# Patient Record
Sex: Female | Born: 1975 | Race: White | Hispanic: No | Smoking: Current every day smoker
Health system: Southern US, Community
[De-identification: ages and names within clinical notes are randomized; demographics above are authoritative.]

## PROBLEM LIST (undated history)

## (undated) DIAGNOSIS — F419 Anxiety disorder, unspecified: Secondary | ICD-10-CM

## (undated) DIAGNOSIS — I10 Essential (primary) hypertension: Secondary | ICD-10-CM

---

## 1998-08-24 ENCOUNTER — Emergency Department (HOSPITAL_COMMUNITY): Admission: EM | Admit: 1998-08-24 | Discharge: 1998-08-24 | Payer: Self-pay | Admitting: Emergency Medicine

## 1998-11-15 ENCOUNTER — Emergency Department (HOSPITAL_COMMUNITY): Admission: EM | Admit: 1998-11-15 | Discharge: 1998-11-15 | Payer: Self-pay

## 1999-01-31 ENCOUNTER — Encounter: Payer: Self-pay | Admitting: Emergency Medicine

## 1999-01-31 ENCOUNTER — Emergency Department (HOSPITAL_COMMUNITY): Admission: EM | Admit: 1999-01-31 | Discharge: 1999-01-31 | Payer: Self-pay | Admitting: Emergency Medicine

## 1999-07-22 ENCOUNTER — Ambulatory Visit (HOSPITAL_COMMUNITY): Admission: RE | Admit: 1999-07-22 | Discharge: 1999-07-22 | Payer: Self-pay | Admitting: Cardiovascular Disease

## 1999-10-23 ENCOUNTER — Emergency Department (HOSPITAL_COMMUNITY): Admission: EM | Admit: 1999-10-23 | Discharge: 1999-10-23 | Payer: Self-pay | Admitting: Emergency Medicine

## 1999-10-27 ENCOUNTER — Emergency Department (HOSPITAL_COMMUNITY): Admission: EM | Admit: 1999-10-27 | Discharge: 1999-10-27 | Payer: Self-pay | Admitting: Emergency Medicine

## 2000-07-01 ENCOUNTER — Emergency Department (HOSPITAL_COMMUNITY): Admission: EM | Admit: 2000-07-01 | Discharge: 2000-07-01 | Payer: Self-pay | Admitting: Internal Medicine

## 2000-07-18 ENCOUNTER — Emergency Department (HOSPITAL_COMMUNITY): Admission: EM | Admit: 2000-07-18 | Discharge: 2000-07-18 | Payer: Self-pay | Admitting: Emergency Medicine

## 2000-07-26 ENCOUNTER — Inpatient Hospital Stay (HOSPITAL_COMMUNITY): Admission: AD | Admit: 2000-07-26 | Discharge: 2000-07-26 | Payer: Self-pay | Admitting: *Deleted

## 2000-10-24 ENCOUNTER — Emergency Department (HOSPITAL_COMMUNITY): Admission: EM | Admit: 2000-10-24 | Discharge: 2000-10-24 | Payer: Self-pay | Admitting: Emergency Medicine

## 2000-12-02 ENCOUNTER — Emergency Department (HOSPITAL_COMMUNITY): Admission: EM | Admit: 2000-12-02 | Discharge: 2000-12-02 | Payer: Self-pay | Admitting: *Deleted

## 2001-06-20 ENCOUNTER — Other Ambulatory Visit: Admission: RE | Admit: 2001-06-20 | Discharge: 2001-06-20 | Payer: Self-pay | Admitting: Family Medicine

## 2003-03-10 ENCOUNTER — Encounter: Admission: RE | Admit: 2003-03-10 | Discharge: 2003-03-10 | Payer: Self-pay | Admitting: Cardiovascular Disease

## 2003-03-10 ENCOUNTER — Encounter: Payer: Self-pay | Admitting: Cardiovascular Disease

## 2003-12-10 ENCOUNTER — Emergency Department (HOSPITAL_COMMUNITY): Admission: EM | Admit: 2003-12-10 | Discharge: 2003-12-10 | Payer: Self-pay | Admitting: Emergency Medicine

## 2004-11-11 ENCOUNTER — Ambulatory Visit: Payer: Self-pay | Admitting: Internal Medicine

## 2004-11-11 ENCOUNTER — Ambulatory Visit (HOSPITAL_BASED_OUTPATIENT_CLINIC_OR_DEPARTMENT_OTHER): Admission: RE | Admit: 2004-11-11 | Discharge: 2004-11-11 | Payer: Self-pay | Admitting: Family Medicine

## 2005-05-18 ENCOUNTER — Ambulatory Visit (HOSPITAL_COMMUNITY): Admission: RE | Admit: 2005-05-18 | Discharge: 2005-05-18 | Payer: Self-pay | Admitting: Family Medicine

## 2006-06-02 ENCOUNTER — Inpatient Hospital Stay (HOSPITAL_COMMUNITY): Admission: AD | Admit: 2006-06-02 | Discharge: 2006-06-03 | Payer: Self-pay | Admitting: Family Medicine

## 2006-06-16 ENCOUNTER — Inpatient Hospital Stay (HOSPITAL_COMMUNITY): Admission: AD | Admit: 2006-06-16 | Discharge: 2006-06-16 | Payer: Self-pay | Admitting: Obstetrics and Gynecology

## 2007-04-11 ENCOUNTER — Emergency Department (HOSPITAL_COMMUNITY): Admission: EM | Admit: 2007-04-11 | Discharge: 2007-04-11 | Payer: Self-pay | Admitting: Emergency Medicine

## 2007-04-13 ENCOUNTER — Emergency Department (HOSPITAL_COMMUNITY): Admission: EM | Admit: 2007-04-13 | Discharge: 2007-04-13 | Payer: Self-pay | Admitting: Family Medicine

## 2007-10-12 ENCOUNTER — Emergency Department: Payer: Self-pay | Admitting: Emergency Medicine

## 2008-04-28 ENCOUNTER — Emergency Department (HOSPITAL_COMMUNITY): Admission: EM | Admit: 2008-04-28 | Discharge: 2008-04-28 | Payer: Self-pay | Admitting: Emergency Medicine

## 2008-04-28 ENCOUNTER — Emergency Department (HOSPITAL_COMMUNITY): Admission: EM | Admit: 2008-04-28 | Discharge: 2008-04-29 | Payer: Self-pay | Admitting: Emergency Medicine

## 2008-05-09 ENCOUNTER — Emergency Department (HOSPITAL_COMMUNITY): Admission: EM | Admit: 2008-05-09 | Discharge: 2008-05-10 | Payer: Self-pay | Admitting: Emergency Medicine

## 2009-07-21 ENCOUNTER — Emergency Department: Payer: Self-pay | Admitting: Emergency Medicine

## 2010-07-25 ENCOUNTER — Emergency Department (HOSPITAL_COMMUNITY): Admission: EM | Admit: 2010-07-25 | Discharge: 2010-07-26 | Payer: Self-pay | Admitting: Emergency Medicine

## 2010-10-22 ENCOUNTER — Emergency Department: Payer: Self-pay | Admitting: Emergency Medicine

## 2010-11-13 ENCOUNTER — Encounter: Payer: Self-pay | Admitting: Family Medicine

## 2011-01-05 LAB — DIFFERENTIAL
Basophils Absolute: 0 10*3/uL (ref 0.0–0.1)
Basophils Relative: 0 % (ref 0–1)
Eosinophils Absolute: 0.1 10*3/uL (ref 0.0–0.7)
Eosinophils Relative: 1 % (ref 0–5)
Monocytes Relative: 4 % (ref 3–12)

## 2011-01-05 LAB — CBC
HCT: 37 % (ref 36.0–46.0)
MCHC: 33.2 g/dL (ref 30.0–36.0)
MCV: 91.4 fL (ref 78.0–100.0)
Platelets: 227 10*3/uL (ref 150–400)
RBC: 4.05 MIL/uL (ref 3.87–5.11)
WBC: 10.6 10*3/uL — ABNORMAL HIGH (ref 4.0–10.5)

## 2011-01-05 LAB — POCT I-STAT, CHEM 8
BUN: <3 mg/dL — ABNORMAL LOW (ref 6–23)
Chloride: 106 mEq/L (ref 96–112)
Glucose, Bld: 83 mg/dL (ref 70–99)
HCT: 38 % (ref 36.0–46.0)
Hemoglobin: 12.9 g/dL (ref 12.0–15.0)
Potassium: 3.2 mEq/L — ABNORMAL LOW (ref 3.5–5.1)
Sodium: 143 mEq/L (ref 135–145)

## 2011-01-05 LAB — URINALYSIS, ROUTINE W REFLEX MICROSCOPIC
Glucose, UA: NEGATIVE mg/dL
Hgb urine dipstick: NEGATIVE
Protein, ur: NEGATIVE mg/dL
Urobilinogen, UA: 0.2 mg/dL (ref 0.0–1.0)

## 2011-01-05 LAB — D-DIMER, QUANTITATIVE: D-Dimer, Quant: 0.72 ug/mL-FEU — ABNORMAL HIGH (ref 0.00–0.48)

## 2011-01-05 LAB — POCT CARDIAC MARKERS: Troponin i, poc: <0.05 ng/mL (ref 0.00–0.09)

## 2011-03-10 NOTE — Procedures (Signed)
Diana Lawson, Diana Lawson               ACCOUNT NO.:  [id number]   MEDICAL RECORD NO.:  [id number]          PATIENT TYPE:  OUT   LOCATION:  SLEEP CENTER                 FACILITY:  [facility]   PHYSICIAN:  Clinton D. Maple Hudson, M.D. DATE OF BIRTH:  Aug 04, 1976   DATE OF STUDY:  11/11/2004                              NOCTURNAL POLYSOMNOGRAM   STUDY DATE:  November 11, 2004   REFERRING PHYSICIAN:  Dr. Windle Guard   INDICATION FOR STUDY:  Hypersomnia with sleep apnea.  Epworth Sleepiness  Score 4/24.  BMI 36.9.  Weight 230 pounds.  Listed medications include  Klonopin which may cause daytime somnolence.  No sleep medications were  taken for this study.   SLEEP ARCHITECTURE:  Short total sleep time 235 minutes with sleep  efficiency 61%.  Stage I was 3%, stage II 34%, stages III and IV 15%, REM  was 8% of total sleep time.  Sleep latency 127 minutes, REM latency 134  minutes, awake after sleep onset 14 minutes, arousal index 13.  Sleep onset  was delayed, 12:47 a.m.  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   RESPIRATORY DATA:  Respiratory disturbance index 3.3 obstructive events per  hour.  This is within normal limits, reflecting a total of 13 hypopneas.  It  does not meet diagnostic criteria for obstructive sleep apnea/hypopnea  syndrome.  Events were only recorded when she was sleeping on her back  suggesting that encouraging a sleep position on the sides may be  therapeutic.  REM RDI was 20.   OXYGEN DATA:  Mild to moderate snoring with oxygen desaturation to a nadir  of 83% briefly.  Mean oxygen saturation through the study was 94% on room  air.   CARDIAC DATA:  Mild sinus tachycardia 105-107 per minute.   MOVEMENT/PARASOMNIA:  Occasional leg jerk, insignificant.   IMPRESSION/RECOMMENDATION:  Occasional sleep-disordered breathing events,  within normal limits, not  meeting diagnostic criteria for obstructive sleep  apnea syndrome and not qualifying by usual criteria for  administration of continuous positive airway pressure.  She may benefit from  instruction to sleep off the flat of her back, weight loss, and treatment  for nasal congestion if appropriate.      CDY/MEDQ  D:  11/13/2004 16:02:30  T:  11/13/2004 19:38:42  Job:  [id number]

## 2011-07-20 LAB — POCT CARDIAC MARKERS
CKMB, poc: <1 — ABNORMAL LOW
Myoglobin, poc: 65.9
Operator id: 277751
Troponin i, poc: <0.05

## 2011-07-20 LAB — POCT I-STAT, CHEM 8
Chloride: 107
Glucose, Bld: 78
HCT: 39
Sodium: 140
TCO2: 25

## 2011-09-20 IMAGING — CT CT ANGIO CHEST
2 of 6 series · 19 of 36 positions shown · IV contrast (APPLIED)
Comparison: Chest radiographs 04/28/2008.

CLINICAL DATA: 34-year-old female with shortness of breath, chest
pain, elevated D-dimer

CT ANGIOGRAPHY CHEST WITH CONTRAST
TECHNIQUE: Multidetector CT imaging of the chest was performed
using the standard protocol during bolus administration of
intravenous contrast.  Multiplanar CT image reconstructions
including MIPs were obtained to evaluate the vascular anatomy.
Contrast:  100 ml [id number].

[Series 8: pulm embolism 1.0 b25f thins · axial · 0.57mm/px · z∈[-254,-58]mm · 18 of 218 slices shown]
[im 11/218  lung]
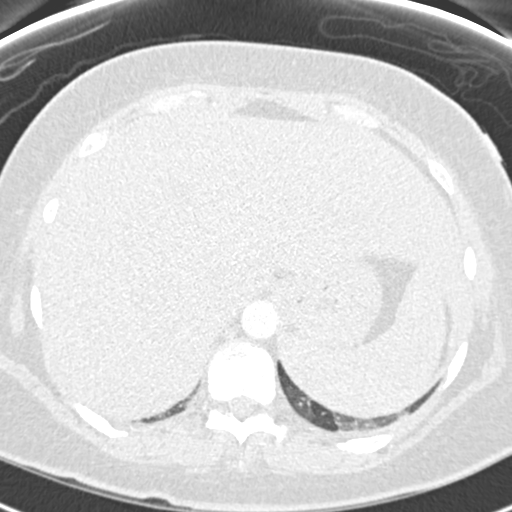
[im 22/218  mediastinal]
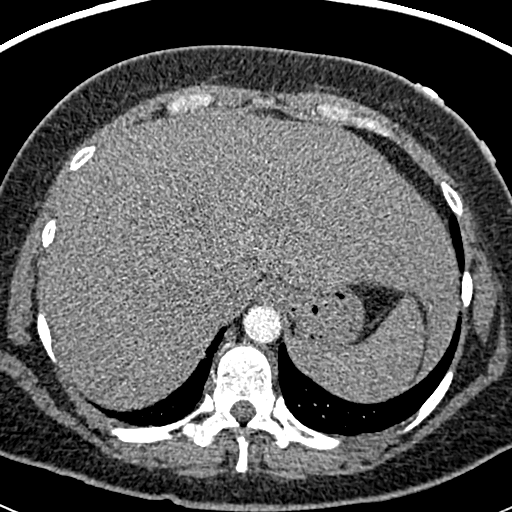
[im 33/218  lung]
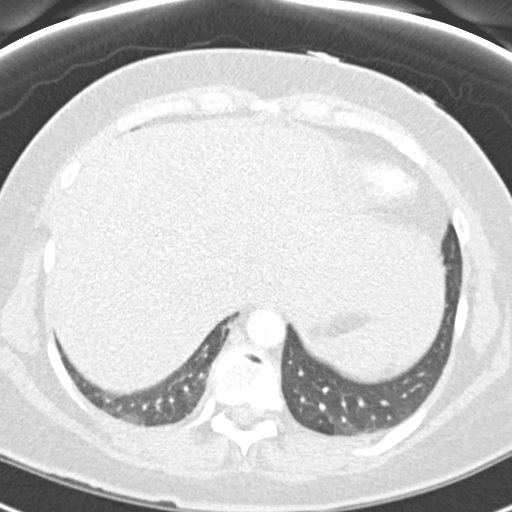
[im 44/218  mediastinal]
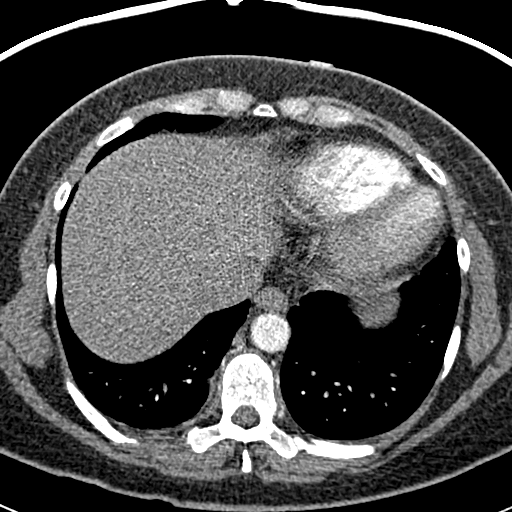
[im 55/218  lung]
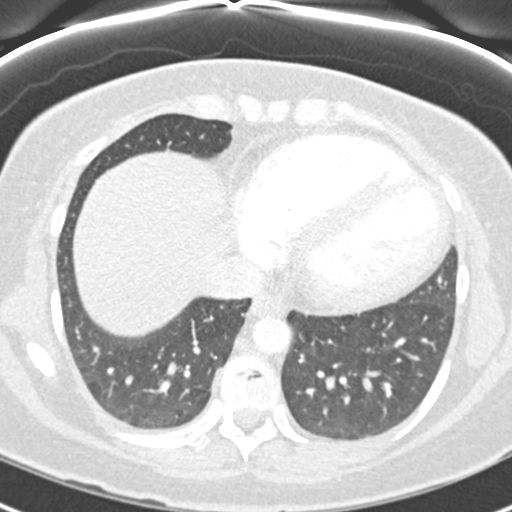
[im 66/218  mediastinal]
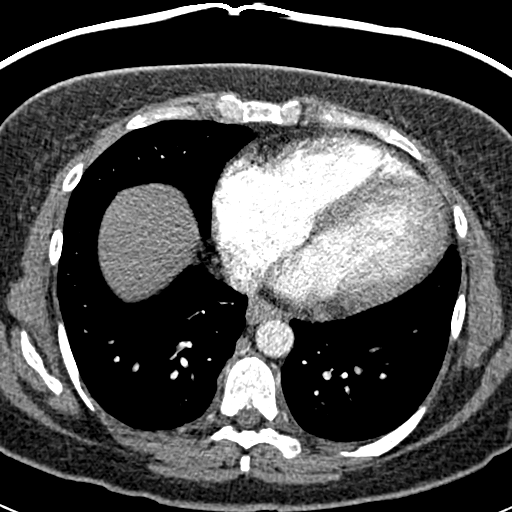
[im 76/218  lung]
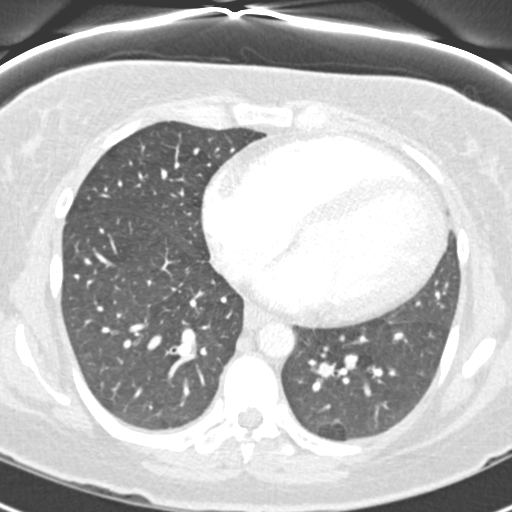
[im 87/218  mediastinal]
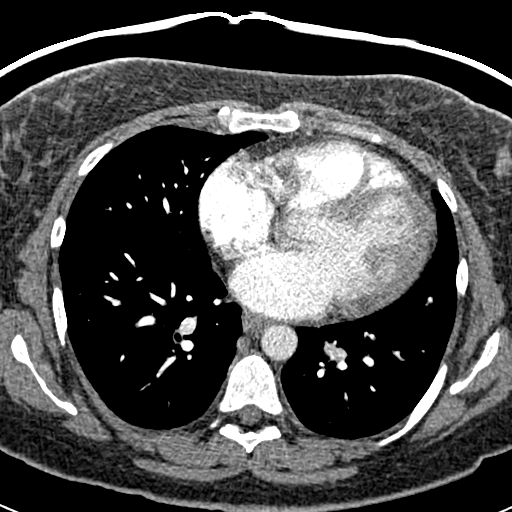
[im 98/218  lung]
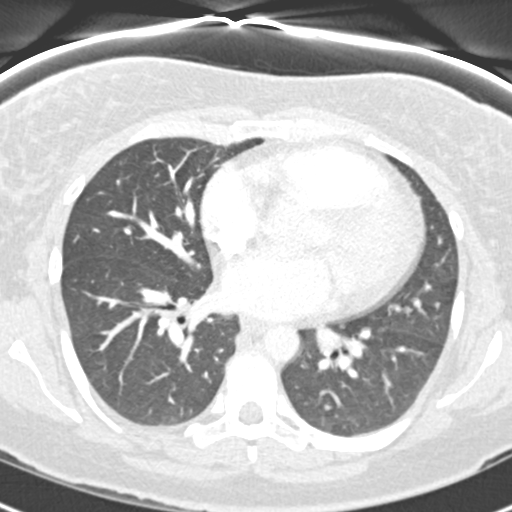
[im 120/218  mediastinal]
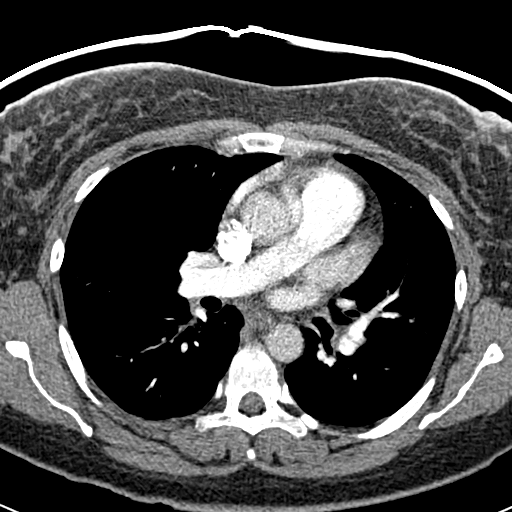
[im 131/218  lung]
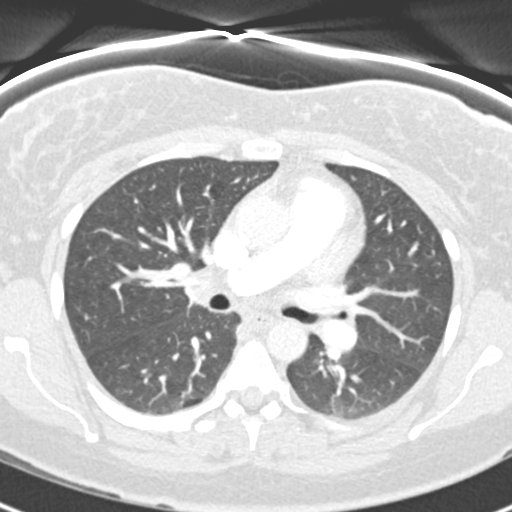
[im 142/218  mediastinal]
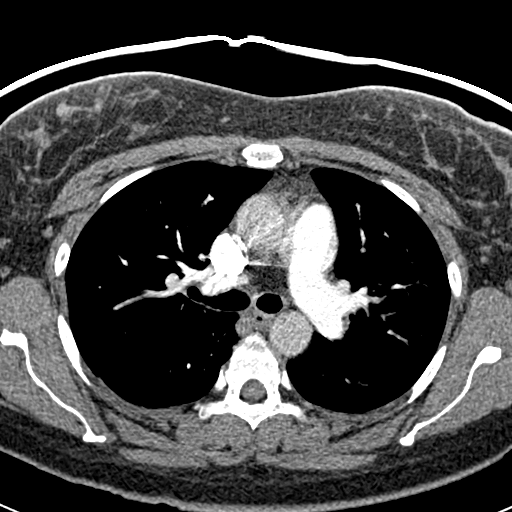
[im 152/218  lung]
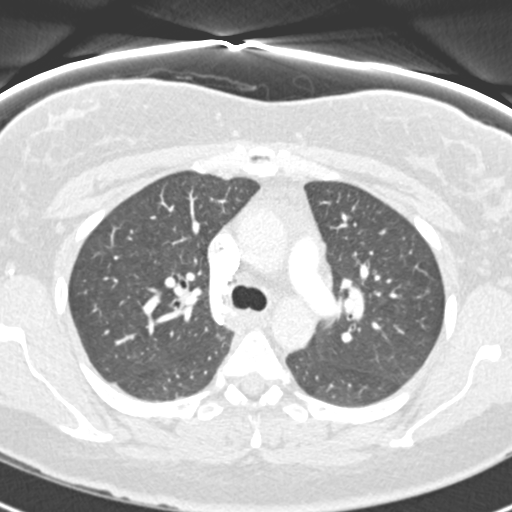
[im 163/218  mediastinal]
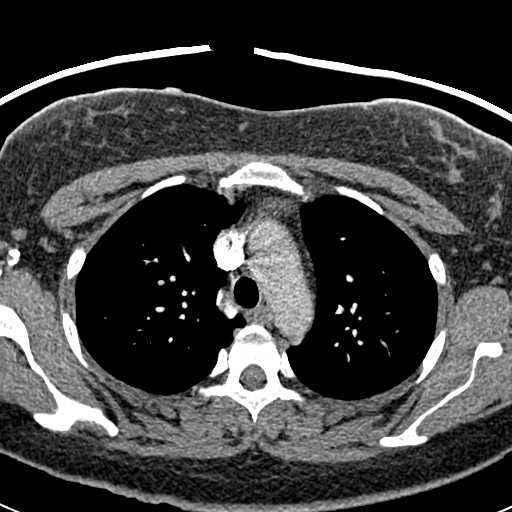
[im 174/218  lung]
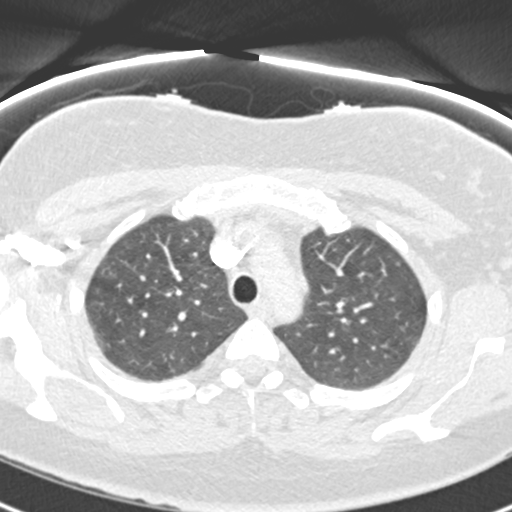
[im 185/218  mediastinal]
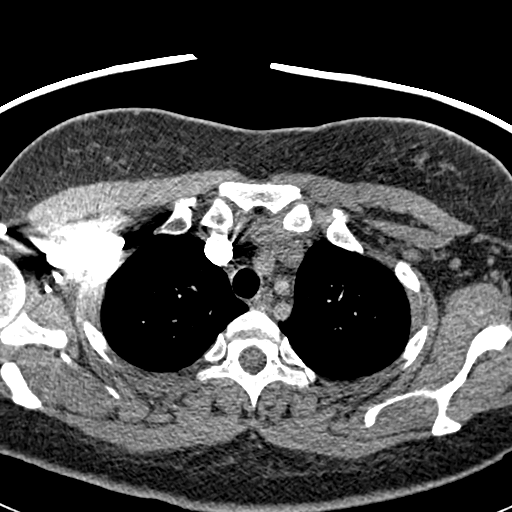
[im 196/218  lung]
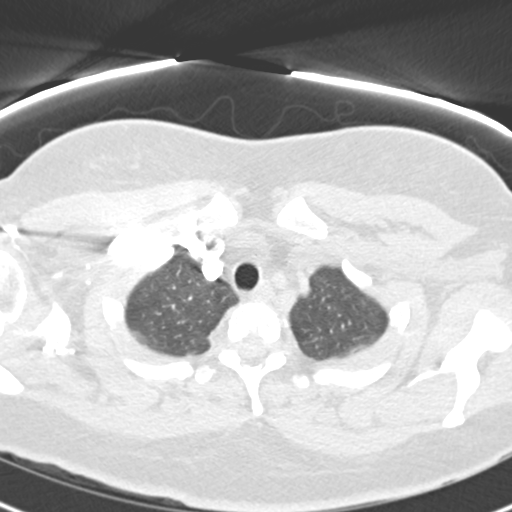
[im 207/218  mediastinal]
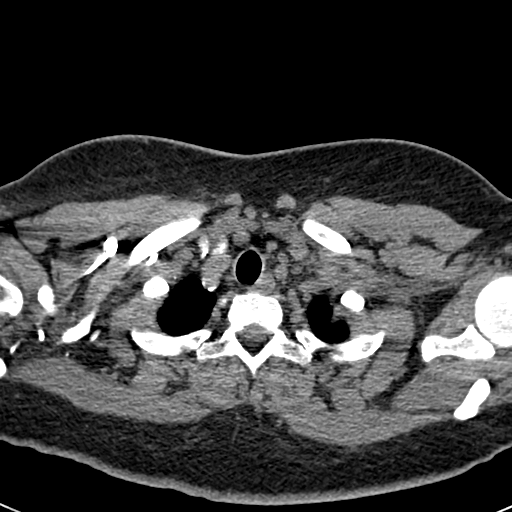

[Series 605: <mpr thick cor · coronal · 0.57mm/px · 1 of 98 slices shown]
[im 49/98  mediastinal]
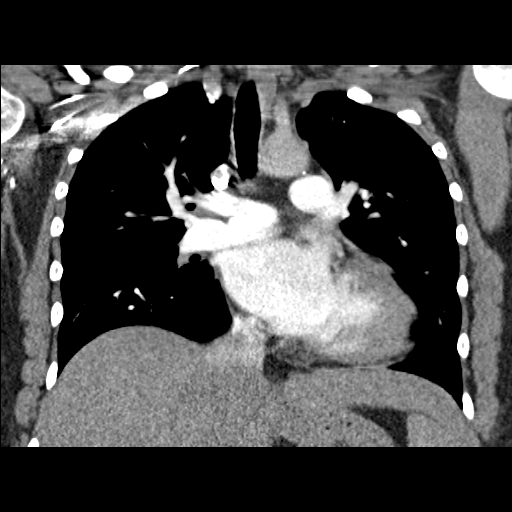

[19 of 36 positions shown; findings below may reference images not displayed]

FINDINGS: Adequate contrast bolus timing in the pulmonary arterial
tree.  Intermittent mild respiratory motion. No focal filling
defect identified in the pulmonary arterial tree to suggest the
presence of acute pulmonary embolism.

Major airways are patent.  Suggestion of mild ground-glass opacity
diffusely, more pronounced at the lung bases.  There are some small
areas of mosaic attenuation in the lower lobes.  Typical
atelectasis in the costophrenic sulci.  Otherwise, the lungs are
clear.

Cardiomegaly.  No pericardial or pleural effusion.  The distal
thoracic aorta and visualized abdominal aorta are unremarkable.
Visualized upper abdominal viscera are within normal limits. No
lymphadenopathy.  Visualized thoracic inlet within normal limits.

Degenerative changes in the lower thoracic spine. No acute osseous
abnormality identified.

Review of the MIP images confirms the above findings.
IMPRESSION: 1. No evidence of acute pulmonary embolus.
2.  Mild diffuse ground-glass opacity in the lungs, nonspecific but
favor related to suboptimal inspiration.
3.  Cardiomegaly.

## 2015-01-19 ENCOUNTER — Emergency Department (HOSPITAL_BASED_OUTPATIENT_CLINIC_OR_DEPARTMENT_OTHER): Payer: Medicaid Other

## 2015-01-19 ENCOUNTER — Emergency Department (HOSPITAL_BASED_OUTPATIENT_CLINIC_OR_DEPARTMENT_OTHER)
Admission: EM | Admit: 2015-01-19 | Discharge: 2015-01-19 | Disposition: A | Discharge status: Home / Self Care | Payer: Medicaid Other | Attending: Emergency Medicine | Admitting: Emergency Medicine

## 2015-01-19 ENCOUNTER — Encounter (HOSPITAL_BASED_OUTPATIENT_CLINIC_OR_DEPARTMENT_OTHER): Payer: Self-pay | Admitting: *Deleted

## 2015-01-19 DIAGNOSIS — Z880 Allergy status to penicillin: Secondary | ICD-10-CM | POA: Insufficient documentation

## 2015-01-19 DIAGNOSIS — Z720 Tobacco use: Secondary | ICD-10-CM | POA: Diagnosis not present

## 2015-01-19 DIAGNOSIS — T1490XA Injury, unspecified, initial encounter: Secondary | ICD-10-CM

## 2015-01-19 DIAGNOSIS — Y9289 Other specified places as the place of occurrence of the external cause: Secondary | ICD-10-CM | POA: Insufficient documentation

## 2015-01-19 DIAGNOSIS — Y998 Other external cause status: Secondary | ICD-10-CM | POA: Diagnosis not present

## 2015-01-19 DIAGNOSIS — M79671 Pain in right foot: Secondary | ICD-10-CM

## 2015-01-19 DIAGNOSIS — I10 Essential (primary) hypertension: Secondary | ICD-10-CM | POA: Insufficient documentation

## 2015-01-19 DIAGNOSIS — Z79899 Other long term (current) drug therapy: Secondary | ICD-10-CM | POA: Insufficient documentation

## 2015-01-19 DIAGNOSIS — X58XXXA Exposure to other specified factors, initial encounter: Secondary | ICD-10-CM | POA: Insufficient documentation

## 2015-01-19 DIAGNOSIS — F419 Anxiety disorder, unspecified: Secondary | ICD-10-CM | POA: Insufficient documentation

## 2015-01-19 DIAGNOSIS — S99921A Unspecified injury of right foot, initial encounter: Secondary | ICD-10-CM | POA: Diagnosis present

## 2015-01-19 DIAGNOSIS — S93491A Sprain of other ligament of right ankle, initial encounter: Secondary | ICD-10-CM | POA: Insufficient documentation

## 2015-01-19 DIAGNOSIS — Y9389 Activity, other specified: Secondary | ICD-10-CM | POA: Diagnosis not present

## 2015-01-19 DIAGNOSIS — S93401A Sprain of unspecified ligament of right ankle, initial encounter: Secondary | ICD-10-CM

## 2015-01-19 HISTORY — DX: Essential (primary) hypertension: I10

## 2015-01-19 HISTORY — DX: Anxiety disorder, unspecified: F41.9

## 2015-01-19 MED ORDER — HYDROCODONE-ACETAMINOPHEN 5-325 MG PO TABS
1.0000 | ORAL_TABLET | Freq: Once | ORAL | Status: AC
  Administered 2015-01-19: 1 via ORAL
  Filled 2015-01-19: qty 1

## 2015-01-19 MED ORDER — IBUPROFEN 600 MG PO TABS: 600.0000 mg | ORAL_TABLET | Freq: Four times a day (QID) | ORAL | Status: AC | PRN

## 2015-01-19 MED ORDER — IBUPROFEN 200 MG PO TABS
600.0000 mg | ORAL_TABLET | Freq: Once | ORAL | Status: AC
  Administered 2015-01-19: 600 mg via ORAL
  Filled 2015-01-19 (×2): qty 1

## 2015-01-19 MED ORDER — HYDROCODONE-ACETAMINOPHEN 5-325 MG PO TABS: 1.0000 | ORAL_TABLET | Freq: Four times a day (QID) | ORAL | Status: DC | PRN

## 2015-01-19 NOTE — ED Provider Notes (Signed)
CSN: [id number]     Arrival date & time 01/19/15  1355 History   First MD Initiated Contact with Patient 01/19/15 1422     Chief Complaint  Patient presents with  . Foot Injury     (Consider location/radiation/quality/duration/timing/severity/associated sxs/prior Treatment) HPI Pt is a 39yo female with hx of anxiety and HTN, presenting to ED with c/o gradually worsening Right ankle pain that started around 10AM after she dropped part of dresser on the top of her right foot. Pt states during the incident she stumbled and believes she also twisted her ankle slightly. Pt did not fall or sustain any other injuries. Reports taking tylenol PTA w/o relief. No previous injury to same ankle. Denies numbness or change in skin color.   Past Medical History  Diagnosis Date  . Anxiety   . Hypertension    History reviewed. No pertinent past surgical history. No family history on file. History  Substance Use Topics  . Smoking status: Current Every Day Smoker -- 0.50 packs/day    Types: Cigarettes  . Smokeless tobacco: Not on file  . Alcohol Use: Yes     Comment: occasionally   OB History    No data available     Review of Systems  Constitutional: Negative for fever and chills.  Respiratory: Negative for cough and shortness of breath.   Cardiovascular: Negative for chest pain.  Musculoskeletal: Positive for myalgias, joint swelling and arthralgias.       Right ankle  Skin: Negative for color change and wound.  Neurological: Negative for numbness.  All other systems reviewed and are negative.     Allergies  Penicillins  Home Medications   Prior to Admission medications   Medication Sig Start Date End Date Taking? Authorizing Provider  Diazepam (VALIUM PO) Take by mouth.   Yes Historical Provider, MD  HYDROcodone-acetaminophen (NORCO/VICODIN) 5-325 MG per tablet Take 1-2 tablets by mouth every 6 (six) hours as needed. 01/19/15   Junius Finner, PA-C  ibuprofen (ADVIL,MOTRIN) 600 MG  tablet Take 1 tablet (600 mg total) by mouth every 6 (six) hours as needed. 01/19/15   Junius Finner, PA-C   BP 158/94 mmHg  Pulse 109  Temp(Src) 98.7 F (37.1 C) (Oral)  Resp 20  Ht  (1.651 m)  Wt 185 lb (83.915 kg)  BMI 30.79 kg/m2  SpO2 96%  LMP 01/05/2015 Physical Exam  Constitutional: She is oriented to person, place, and time. She appears well-developed and well-nourished.  HENT:  Head: Normocephalic and atraumatic.  Eyes: EOM are normal.  Neck: Normal range of motion.  Cardiovascular: Normal rate.   Pulses:      Dorsalis pedis pulses are 2+ on the right side.  Right foot: cap refill <3 sec Tachycardic in triage, regular rate on exam  Pulmonary/Chest: Effort normal.  Musculoskeletal: Normal range of motion. She exhibits edema and tenderness.  Right ankle: no obvious deformity. Mild edema to lateral aspect with tenderness to lateral malleolus and dorsal aspect Right foot. FROM. 4/5 strength compared to left.  FROM Right knee, no calf tenderness.   Neurological: She is alert and oriented to person, place, and time.  Right foot: sensation in tact  Skin: Skin is warm and dry. No erythema.  Skin in tact. No ecchymosis, erythema, or warmth. No red streaking, induration, or evidence of underlying infection.  Psychiatric: She has a normal mood and affect. Her behavior is normal.  Nursing note and vitals reviewed.   ED Course  Procedures (including critical care time)  Labs Review Labs Reviewed - No data to display  Imaging Review Dg Foot Complete Right  01/19/2015   CLINICAL DATA:  Dropped dresser onto foot with pain, initial encounter  EXAM: RIGHT FOOT COMPLETE - 3+ VIEW  COMPARISON:  None.  FINDINGS: There is no evidence of fracture or dislocation. There is no evidence of arthropathy or other focal bone abnormality. Soft tissues are unremarkable.  IMPRESSION: No acute abnormality noted.   Electronically Signed   By: Alcide CleverMark  Lukens M.D.   On: 01/19/2015 14:29     EKG  Interpretation None      MDM   Final diagnoses:  Right ankle sprain, initial encounter  Right foot pain    Pt c/o Right foot and ankle pain after dropping a dresser on it and partially twisting ankle earlier this morning. Foot is neurovascularly in tact. Mild tachycardic in triage, likely due to pain. Pt denies CP or SOB.  Plain films: no acute abnormality. Will tx as sprain. ASO applied, crutches provided. Rx: norco and ibuprofen. Home care instructions provided. Advised to f/u with PCP in 1-2 weeks as pt may need PT or ortho f/u if not improving. Pt verbalized understanding and agreement with tx plan.   Junius Finnerrin O'Malley, PA-C 01/19/15 1515  Vanetta MuldersScott Zackowski, MD 01/25/15 [phone]

## 2015-01-19 NOTE — Discharge Instructions (Signed)
Acute Ankle Sprain °with Phase I Rehab °An acute ankle sprain is a partial or complete tear in one or more of the ligaments of the ankle due to traumatic injury. The severity of the injury depends on both the number of ligaments sprained and the grade of sprain. There are 3 grades of sprains.  °· A grade 1 sprain is a mild sprain. There is a slight pull without obvious tearing. There is no loss of strength, and the muscle and ligament are the correct length. °· A grade 2 sprain is a moderate sprain. There is tearing of fibers within the substance of the ligament where it connects two bones or two cartilages. The length of the ligament is increased, and there is usually decreased strength. °· A grade 3 sprain is a complete rupture of the ligament and is uncommon. °In addition to the grade of sprain, there are three types of ankle sprains.  °Lateral ankle sprains: This is a sprain of one or more of the three ligaments on the outer side (lateral) of the ankle. These are the most common sprains. °Medial ankle sprains: There is one large triangular ligament of the inner side (medial) of the ankle that is susceptible to injury. Medial ankle sprains are less common. °Syndesmosis, "high ankle," sprains: The syndesmosis is the ligament that connects the two bones of the lower leg. Syndesmosis sprains usually only occur with very severe ankle sprains. °SYMPTOMS °· Pain, tenderness, and swelling in the ankle, starting at the side of injury that may progress to the whole ankle and foot with time. °· "Pop" or tearing sensation at the time of injury. °· Bruising that may spread to the heel. °· Impaired ability to walk soon after injury. °CAUSES  °· Acute ankle sprains are caused by trauma placed on the ankle that temporarily forces or pries the anklebone (talus) out of its normal socket. °· Stretching or tearing of the ligaments that normally hold the joint in place (usually due to a twisting injury). °RISK INCREASES  WITH: °· Previous ankle sprain. °· Sports in which the foot may land awkwardly (i.e., basketball, volleyball, or soccer) or walking or running on uneven or rough surfaces. °· Shoes with inadequate support to prevent sideways motion when stress occurs. °· Poor strength and flexibility. °· Poor balance skills. °· Contact sports. °PREVENTION  °· Warm up and stretch properly before activity. °· Maintain physical fitness: °¨ Ankle and leg flexibility, muscle strength, and endurance. °¨ Cardiovascular fitness. °· Balance training activities. °· Use proper technique and have a coach correct improper technique. °· Taping, protective strapping, bracing, or high-top tennis shoes may help prevent injury. Initially, tape is best; however, it loses most of its support function within 10 to 15 minutes. °· Wear proper-fitted protective shoes (High-top shoes with taping or bracing is more effective than either alone). °· Provide the ankle with support during sports and practice activities for 12 months following injury. °PROGNOSIS  °· If treated properly, ankle sprains can be expected to recover completely; however, the length of recovery depends on the degree of injury. °· A grade 1 sprain usually heals enough in 5 to 7 days to allow modified activity and requires an average of 6 weeks to heal completely. °· A grade 2 sprain requires 6 to 10 weeks to heal completely. °· A grade 3 sprain requires 12 to 16 weeks to heal. °· A syndesmosis sprain often takes more than 3 months to heal. °RELATED COMPLICATIONS  °· Frequent recurrence of symptoms may   result in a chronic problem. Appropriately addressing the problem the first time decreases the frequency of recurrence and optimizes healing time. Severity of the initial sprain does not predict the likelihood of later instability. °· Injury to other structures (bone, cartilage, or tendon). °· A chronically unstable or arthritic ankle joint is a possibility with repeated  sprains. °TREATMENT °Treatment initially involves the use of ice, medication, and compression bandages to help reduce pain and inflammation. Ankle sprains are usually immobilized in a walking cast or boot to allow for healing. Crutches may be recommended to reduce pressure on the injury. After immobilization, strengthening and stretching exercises may be necessary to regain strength and a full range of motion. Surgery is rarely needed to treat ankle sprains. °MEDICATION  °· Nonsteroidal anti-inflammatory medications, such as aspirin and ibuprofen (do not take for the first 3 days after injury or within 7 days before surgery), or other minor pain relievers, such as acetaminophen, are often recommended. Take these as directed by your caregiver. Contact your caregiver immediately if any bleeding, stomach upset, or signs of an allergic reaction occur from these medications. °· Ointments applied to the skin may be helpful. °· Pain relievers may be prescribed as necessary by your caregiver. Do not take prescription pain medication for longer than 4 to 7 days. Use only as directed and only as much as you need. °HEAT AND COLD °· Cold treatment (icing) is used to relieve pain and reduce inflammation for acute and chronic cases. Cold should be applied for 10 to 15 minutes every 2 to 3 hours for inflammation and pain and immediately after any activity that aggravates your symptoms. Use ice packs or an ice massage. °· Heat treatment may be used before performing stretching and strengthening activities prescribed by your caregiver. Use a heat pack or a warm soak. °SEEK IMMEDIATE MEDICAL CARE IF:  °· Pain, swelling, or bruising worsens despite treatment. °· You experience pain, numbness, discoloration, or coldness in the foot or toes. °· New, unexplained symptoms develop (drugs used in treatment may produce side effects.) °EXERCISES  °PHASE I EXERCISES °RANGE OF MOTION (ROM) AND STRETCHING EXERCISES - Ankle Sprain, Acute Phase I,  Weeks 1 to 2 °These exercises may help you when beginning to restore flexibility in your ankle. You will likely work on these exercises for the 1 to 2 weeks after your injury. Once your physician, physical therapist, or athletic trainer sees adequate progress, he or she will advance your exercises. While completing these exercises, remember:  °· Restoring tissue flexibility helps normal motion to return to the joints. This allows healthier, less painful movement and activity. °· An effective stretch should be held for at least 30 seconds. °· A stretch should never be painful. You should only feel a gentle lengthening or release in the stretched tissue. ° ° Hold stretches for 20-30 seconds, repeat 2-3 times each, perform exercises 2-3 times a day.  ° ° °RANGE OF MOTION - Dorsi/Plantar Flexion °· While sitting with your right / left knee straight, draw the top of your foot upwards by flexing your ankle. Then reverse the motion, pointing your toes downward. °· Hold each position for __________ seconds. °· After completing your first set of exercises, repeat this exercise with your knee bent. °Repeat __________ times. Complete this exercise __________ times per day.  °RANGE OF MOTION - Ankle Alphabet °· Imagine your right / left big toe is a pen. °· Keeping your hip and knee still, write out the entire alphabet with your "  pen." Make the letters as large as you can without increasing any discomfort. °Repeat __________ times. Complete this exercise __________ times per day.  °STRENGTHENING EXERCISES - Ankle Sprain, Acute -Phase I, Weeks 1 to 2 °These exercises may help you when beginning to restore strength in your ankle. You will likely work on these exercises for 1 to 2 weeks after your injury. Once your physician, physical therapist, or athletic trainer sees adequate progress, he or she will advance your exercises. While completing these exercises, remember:  °· Muscles can gain both the endurance and the strength  needed for everyday activities through controlled exercises. °· Complete these exercises as instructed by your physician, physical therapist, or athletic trainer. Progress the resistance and repetitions only as guided. °· You may experience muscle soreness or fatigue, but the pain or discomfort you are trying to eliminate should never worsen during these exercises. If this pain does worsen, stop and make certain you are following the directions exactly. If the pain is still present after adjustments, discontinue the exercise until you can discuss the trouble with your clinician. °STRENGTH - Dorsiflexors °· Secure a rubber exercise band/tubing to a fixed object (i.e., table, pole) and loop the other end around your right / left foot. °· Sit on the floor facing the fixed object. The band/tubing should be slightly tense when your foot is relaxed. °· Slowly draw your foot back toward you using your ankle and toes. °· Hold this position for __________ seconds. Slowly release the tension in the band and return your foot to the starting position. °Repeat __________ times. Complete this exercise __________ times per day.  °STRENGTH - Plantar-flexors  °· Sit with your right / left leg extended. Holding onto both ends of a rubber exercise band/tubing, loop it around the ball of your foot. Keep a slight tension in the band. °· Slowly push your toes away from you, pointing them downward. °· Hold this position for __________ seconds. Return slowly, controlling the tension in the band/tubing. °Repeat __________ times. Complete this exercise __________ times per day.  °STRENGTH - Ankle Eversion °· Secure one end of a rubber exercise band/tubing to a fixed object (table, pole). Loop the other end around your foot just before your toes. °· Place your fists between your knees. This will focus your strengthening at your ankle. °· Drawing the band/tubing across your opposite foot, slowly, pull your little toe out and up. Make sure the  band/tubing is positioned to resist the entire motion. °· Hold this position for __________ seconds. °Have your muscles resist the band/tubing as it slowly pulls your foot back to the starting position.  °Repeat __________ times. Complete this exercise __________ times per day.  °STRENGTH - Ankle Inversion °· Secure one end of a rubber exercise band/tubing to a fixed object (table, pole). Loop the other end around your foot just before your toes. °· Place your fists between your knees. This will focus your strengthening at your ankle. °· Slowly, pull your big toe up and in, making sure the band/tubing is positioned to resist the entire motion. °· Hold this position for __________ seconds. °· Have your muscles resist the band/tubing as it slowly pulls your foot back to the starting position. °Repeat __________ times. Complete this exercises __________ times per day.  °STRENGTH - Towel Curls °· Sit in a chair positioned on a non-carpeted surface. °· Place your right / left foot on a towel, keeping your heel on the floor. °· Pull the towel toward your   heel by only curling your toes. Keep your heel on the floor. °· If instructed by your physician, physical therapist, or athletic trainer, add weight to the end of the towel. °Repeat __________ times. Complete this exercise __________ times per day. °Document Released: 05/10/2005 Document Revised: 02/23/2014 Document Reviewed: 01/21/2009 °[employer]® Patient Information ©2015 [employer]. This information is not intended to replace advice given to you by your health care provider. Make sure you discuss any questions you have with your health care provider. ° °

## 2015-01-19 NOTE — ED Notes (Signed)
Moving a dresser this am and dropped part of it on the top of her right foot.

## 2018-01-01 ENCOUNTER — Other Ambulatory Visit: Payer: Self-pay

## 2018-01-01 ENCOUNTER — Ambulatory Visit (HOSPITAL_COMMUNITY)
Admission: EM | Admit: 2018-01-01 | Discharge: 2018-01-01 | Disposition: A | Discharge status: Home / Self Care | Payer: Self-pay | Attending: Family Medicine | Admitting: Family Medicine

## 2018-01-01 ENCOUNTER — Encounter (HOSPITAL_COMMUNITY): Payer: Self-pay | Admitting: Emergency Medicine

## 2018-01-01 DIAGNOSIS — K0889 Other specified disorders of teeth and supporting structures: Secondary | ICD-10-CM

## 2018-01-01 MED ORDER — HYDROCODONE-ACETAMINOPHEN 5-325 MG PO TABS: 1.0000 | ORAL_TABLET | Freq: Four times a day (QID) | ORAL | 0 refills | Status: DC | PRN

## 2018-01-01 MED ORDER — CLINDAMYCIN HCL 300 MG PO CAPS: 300.0000 mg | ORAL_CAPSULE | Freq: Three times a day (TID) | ORAL | 0 refills | Status: DC

## 2018-01-01 NOTE — ED Triage Notes (Signed)
Onset 1 1/2 days ago.  Tooth pain is bottom left, patient has a large amount of facial swelling

## 2018-01-01 NOTE — ED Provider Notes (Signed)
  [facility]   [id number] 01/01/18 Arrival Time: 1217  ASSESSMENT & PLAN:  1. Pain, dental     Meds ordered this encounter  Medications  . HYDROcodone-acetaminophen (NORCO/VICODIN) 5-325 MG tablet    Sig: Take 1 tablet by mouth every 6 (six) hours as needed for moderate pain or severe pain.    Dispense:  10 tablet    Refill:  0  . clindamycin (CLEOCIN) 300 MG capsule    Sig: Take 1 capsule (300 mg total) by mouth 3 (three) times daily.    Dispense:  30 capsule    Refill:  0   [place] Controlled Substances Registry consulted for this patient. I feel the risk/benefit ratio today is favorable for proceeding with this prescription for a controlled substance. Medication sedation precautions given.  Dental resource written instructions given. She will schedule dental evaluation as soon as possible. May f/u here if needed.  Reviewed expectations re: course of current medical issues. Questions answered. Outlined signs and symptoms indicating need for more acute intervention. Patient verbalized understanding. After Visit Summary given.   SUBJECTIVE:  Diana Lawson is a 42 y.o. female who reports gradual onset of left lower dental pain described as a dull ache with fairly abrupt swelling over the past 12 hours. Present for 2 days. Afebrile. Tolerating PO intake but reports pain with chewing. Normal swallowing. She does not see a dentist regularly. No neck swelling or pain. OTC analgesics without relief.  ROS: As per HPI.  OBJECTIVE:  Vitals:   01/01/18 1359  BP: 116/76  Pulse: 63  Resp: 18  Temp: 98.1 F (36.7 C)  TempSrc: Oral  SpO2: 100%    General appearance: alert; no distress HENT: normocephalic; atraumatic; dentition: fair; gingival hypertrophy over left lower gums without areas of fluctuance; obvious swelling over lower left cheek that is tender Neck: supple without LAD Lungs: normal respirations Skin: warm and dry Psychological: alert and cooperative;  normal mood and affect  Allergies  Allergen Reactions  . Penicillins     hives    Past Medical History:  Diagnosis Date  . Anxiety   . Hypertension    Social History   Socioeconomic History  . Marital status: Divorced    Spouse name: Not on file  . Number of children: Not on file  . Years of education: Not on file  . Highest education level: Not on file  Social Needs  . Financial resource strain: Not on file  . Food insecurity - worry: Not on file  . Food insecurity - inability: Not on file  . Transportation needs - medical: Not on file  . Transportation needs - non-medical: Not on file  Occupational History  . Not on file  Tobacco Use  . Smoking status: Current Every Day Smoker    Packs/day: 0.50    Types: Cigarettes  Substance and Sexual Activity  . Alcohol use: Yes    Comment: occasionally  . Drug use: No  . Sexual activity: Not on file  Other Topics Concern  . Not on file  Social History Narrative  . Not on file   Family History  Problem Relation Age of Onset  . Hypertension Mother   . Diabetes Mother       Mardella Layman, MD 01/02/18 [phone]

## 2018-01-01 NOTE — Discharge Instructions (Signed)
Be aware, pain medications may cause drowsiness. Please do not drive, operate heavy machinery or make important decisions while on this medication, it can cloud your judgement.  

## 2019-09-16 ENCOUNTER — Ambulatory Visit (HOSPITAL_COMMUNITY)
Admission: EM | Admit: 2019-09-16 | Discharge: 2019-09-16 | Disposition: A | Discharge status: Home / Self Care | Payer: Self-pay | Attending: Family Medicine | Admitting: Family Medicine

## 2019-09-16 ENCOUNTER — Other Ambulatory Visit: Payer: Self-pay

## 2019-09-16 ENCOUNTER — Encounter (HOSPITAL_COMMUNITY): Payer: Self-pay

## 2019-09-16 DIAGNOSIS — R059 Cough, unspecified: Secondary | ICD-10-CM

## 2019-09-16 DIAGNOSIS — R05 Cough: Secondary | ICD-10-CM | POA: Insufficient documentation

## 2019-09-16 DIAGNOSIS — Z20828 Contact with and (suspected) exposure to other viral communicable diseases: Secondary | ICD-10-CM | POA: Insufficient documentation

## 2019-09-16 DIAGNOSIS — Z7952 Long term (current) use of systemic steroids: Secondary | ICD-10-CM | POA: Insufficient documentation

## 2019-09-16 DIAGNOSIS — I10 Essential (primary) hypertension: Secondary | ICD-10-CM | POA: Insufficient documentation

## 2019-09-16 DIAGNOSIS — Z833 Family history of diabetes mellitus: Secondary | ICD-10-CM | POA: Insufficient documentation

## 2019-09-16 DIAGNOSIS — J22 Unspecified acute lower respiratory infection: Secondary | ICD-10-CM | POA: Insufficient documentation

## 2019-09-16 DIAGNOSIS — Z8249 Family history of ischemic heart disease and other diseases of the circulatory system: Secondary | ICD-10-CM | POA: Insufficient documentation

## 2019-09-16 DIAGNOSIS — F419 Anxiety disorder, unspecified: Secondary | ICD-10-CM | POA: Insufficient documentation

## 2019-09-16 DIAGNOSIS — F17210 Nicotine dependence, cigarettes, uncomplicated: Secondary | ICD-10-CM | POA: Insufficient documentation

## 2019-09-16 DIAGNOSIS — K047 Periapical abscess without sinus: Secondary | ICD-10-CM | POA: Insufficient documentation

## 2019-09-16 DIAGNOSIS — Z79899 Other long term (current) drug therapy: Secondary | ICD-10-CM | POA: Insufficient documentation

## 2019-09-16 DIAGNOSIS — Z880 Allergy status to penicillin: Secondary | ICD-10-CM | POA: Insufficient documentation

## 2019-09-16 DIAGNOSIS — Z792 Long term (current) use of antibiotics: Secondary | ICD-10-CM | POA: Insufficient documentation

## 2019-09-16 MED ORDER — CLINDAMYCIN HCL 300 MG PO CAPS: 300.0000 mg | ORAL_CAPSULE | Freq: Three times a day (TID) | ORAL | 0 refills | Status: AC

## 2019-09-16 MED ORDER — AZITHROMYCIN 250 MG PO TABS: 250.0000 mg | ORAL_TABLET | Freq: Every day | ORAL | 0 refills | Status: AC

## 2019-09-16 MED ORDER — BENZONATATE 200 MG PO CAPS: 200.0000 mg | ORAL_CAPSULE | Freq: Three times a day (TID) | ORAL | 0 refills | Status: AC | PRN

## 2019-09-16 MED ORDER — ALBUTEROL SULFATE HFA 108 (90 BASE) MCG/ACT IN AERS: 1.0000 | INHALATION_SPRAY | Freq: Four times a day (QID) | RESPIRATORY_TRACT | 0 refills | Status: AC | PRN

## 2019-09-16 MED ORDER — PREDNISONE 50 MG PO TABS: 50.0000 mg | ORAL_TABLET | Freq: Every day | ORAL | 0 refills | Status: AC

## 2019-09-16 MED ORDER — HYDROCODONE-ACETAMINOPHEN 5-325 MG PO TABS: 1.0000 | ORAL_TABLET | Freq: Four times a day (QID) | ORAL | 0 refills | Status: AC | PRN

## 2019-09-16 NOTE — ED Triage Notes (Signed)
Pt states she has a abscess x 2 days  and a bad cough x 1 week. Pt states she has a bad tooth in her mouth causing her so much pain.

## 2019-09-16 NOTE — ED Provider Notes (Signed)
[facility]    CSN: [id number] Arrival date & time: 09/16/19  1242      History   Chief Complaint Chief Complaint  Patient presents with  . Abscess  . Cough    HPI Diana Lawson is a 43 y.o. female.   History of hypertension, anxiety, presenting today for evaluation of dental abscess as well as a cough.  Patient states that over the past 2 days she has developed pain and swelling to her right lower jaw.  States that the pain has become unmanageable with taking ibuprofen and Tylenol.  She denies any fevers.  Denies neck stiffness.  Denies difficulty swallowing.  Pain is radiating to her ear.  She notes that she does have bad teeth in this area.  She does not have dental insurance.  She also notes that she has had a cough for approximately 2 weeks.  States that she will have occasional wheezing.  Is a tobacco smoker, smokes less than 1 pack a day.  Denies significant rhinorrhea or sore throat.  Denies any known fevers.  Denies known exposure to Covid.  Has been using Tylenol Cold and flu without relief.  HPI  Past Medical History:  Diagnosis Date  . Anxiety   . Hypertension     There are no active problems to display for this patient.   History reviewed. No pertinent surgical history.  OB History   No obstetric history on file.      Home Medications    Prior to Admission medications   Medication Sig Start Date End Date Taking? Authorizing Provider  albuterol (VENTOLIN HFA) 108 (90 Base) MCG/ACT inhaler Inhale 1-2 puffs into the lungs every 6 (six) hours as needed for wheezing or shortness of breath. 09/16/19   Taeshaun Rames C, PA-C  azithromycin (ZITHROMAX) 250 MG tablet Take 1 tablet (250 mg total) by mouth daily. Take first 2 tablets together, then 1 every day until finished. 09/16/19   Nasrin Lanzo C, PA-C  benzonatate (TESSALON) 200 MG capsule Take 1 capsule (200 mg total) by mouth 3 (three) times daily as needed for up to 7 days for cough. 09/16/19  09/23/19  Hale Chalfin C, PA-C  clindamycin (CLEOCIN) 300 MG capsule Take 1 capsule (300 mg total) by mouth 3 (three) times daily for 7 days. 09/16/19 09/23/19  Uzair Godley C, PA-C  Diazepam (VALIUM PO) Take by mouth.    [provider]  HYDROcodone-acetaminophen (NORCO/VICODIN) 5-325 MG tablet Take 1 tablet by mouth every 6 (six) hours as needed for severe pain. 09/16/19   Fabiana Dromgoole C, PA-C  ibuprofen (ADVIL,MOTRIN) 600 MG tablet Take 1 tablet (600 mg total) by mouth every 6 (six) hours as needed. 01/19/15   Lurene Shadow, PA-C  predniSONE (DELTASONE) 50 MG tablet Take 1 tablet (50 mg total) by mouth daily for 5 days. 09/16/19 09/21/19  Robertine Kipper, Junius Creamer, PA-C    Family History Family History  Problem Relation Age of Onset  . Hypertension Mother   . Diabetes Mother     Social History Social History   Tobacco Use  . Smoking status: Current Every Day Smoker    Packs/day: 0.50    Types: Cigarettes  . Smokeless tobacco: Never Used  Substance Use Topics  . Alcohol use: Yes    Comment: occasionally  . Drug use: No     Allergies   Penicillins   Review of Systems Review of Systems  Constitutional: Negative for activity change, appetite change, chills, fatigue and  fever.  HENT: Positive for congestion, dental problem, facial swelling and rhinorrhea. Negative for ear pain, sinus pressure, sore throat and trouble swallowing.   Eyes: Negative for discharge and redness.  Respiratory: Positive for cough. Negative for chest tightness and shortness of breath.   Cardiovascular: Negative for chest pain.  Gastrointestinal: Negative for abdominal pain, diarrhea, nausea and vomiting.  Musculoskeletal: Negative for myalgias.  Skin: Negative for rash.  Neurological: Negative for dizziness, light-headedness and headaches.     Physical Exam Triage Vital Signs ED Triage Vitals  Enc Vitals Group     BP 09/16/19 1340 (!) 152/68     Pulse Rate 09/16/19 1340 92     Resp  09/16/19 1340 18     Temp 09/16/19 1340 99 F (37.2 C)     Temp Source 09/16/19 1340 Oral     SpO2 09/16/19 1340 99 %     Weight 09/16/19 1338 170 lb (77.1 kg)     Height --      Head Circumference --      Peak Flow --      Pain Score 09/16/19 1337 7     Pain Loc --      Pain Edu? --      Excl. in [place]? --    No data found.  Updated Vital Signs BP (!) 152/68 (BP Location: Right Arm)   Pulse 92   Temp 99 F (37.2 C) (Oral)   Resp 18   Wt 170 lb (77.1 kg)   LMP 09/09/2019   SpO2 99%   BMI 28.29 kg/m   Visual Acuity Right Eye Distance:   Left Eye Distance:   Bilateral Distance:    Right Eye Near:   Left Eye Near:    Bilateral Near:     Physical Exam Vitals signs and nursing note reviewed.  Constitutional:      General: She is not in acute distress.    Appearance: She is well-developed.  HENT:     Head: Normocephalic and atraumatic.     Comments: Facial swelling noted over right lower jaw, tenderness in this area, no erythema, does not extend below mandible    Ears:     Comments: Bilateral ears without tenderness to palpation of external auricle, tragus and mastoid, EAC's without erythema or swelling, TM's with good bony landmarks and cone of light. Non erythematous.    Mouth/Throat:     Comments: Oral mucosa pink and moist, no tonsillar enlargement or exudate. Posterior pharynx patent and nonerythematous, no uvula deviation or swelling. Normal phonation.  Multiple missing teeth, dentition in poor repair Gingival erythema swelling and tenderness to right lower jaw Eyes:     Conjunctiva/sclera: Conjunctivae normal.  Neck:     Musculoskeletal: Neck supple.     Comments: Full active range of motion of neck, no overlying neck swelling or erythema Cardiovascular:     Rate and Rhythm: Normal rate and regular rhythm.     Heart sounds: No murmur.  Pulmonary:     Effort: Pulmonary effort is normal. No respiratory distress.     Breath sounds: Normal breath sounds.      Comments: Breathing comfortably at rest, CTABL, no wheezing, rales or other adventitious sounds auscultated Abdominal:     Palpations: Abdomen is soft.     Tenderness: There is no abdominal tenderness.  Skin:    General: Skin is warm and dry.  Neurological:     Mental Status: She is alert.      UC Treatments /  Results  Labs (all labs ordered are listed, but only abnormal results are displayed) Labs Reviewed  NOVEL CORONAVIRUS, NAA (HOSP ORDER, SEND-OUT TO REF LAB; TAT 18-24 HRS)    EKG   Radiology No results found.  Procedures Procedures (including critical care time)  Medications Ordered in UC Medications - No data to display  Initial Impression / Assessment and Plan / UC Course  I have reviewed the triage vital signs and the nursing notes.  Pertinent labs & imaging results that were available during my care of the patient were reviewed by me and considered in my medical decision making (see chart for details).     Patient has dental abscess.  Will initiate on clindamycin, continue Tylenol and ibuprofen for mild to moderate pain.  Did provide 2 days worth of hydrocodone to use for severe pain.  Discussed risks associated with this and advised to use sparingly.  Do not drive or work after taking.  Airway patent, no sign of deep space infection.  Continue to monitor.  If swelling and symptoms worsening follow-up in emergency room.  Patient has also had a cough x2 weeks, history of tobacco use, lungs clear to auscultation currently.  Will treat for bronchitis.  Given length of symptoms will place on azithromycin to cover atypicals.  Tessalon for cough.  Albuterol inhaler as needed for shortness of breath and wheezing.  Prednisone x5 days.  Covid swab pending.  Discussed strict return precautions. Patient verbalized understanding and is agreeable with plan.    Final Clinical Impressions(s) / UC Diagnoses   Final diagnoses:  Dental abscess  Cough  Lower respiratory  infection (e.g., bronchitis, pneumonia, pneumonitis, pulmonitis)     Discharge Instructions     Dental abscess Please begin taking clindamycin every 8 hours for the next week to treat dental abscess Use anti-inflammatories for pain/swelling. You may take up to 800 mg Ibuprofen every 8 hours with food. You may supplement Ibuprofen with Tylenol [phone] mg every 8 hours.  May use hydrocodone for severe/nightime pain, do not drive or work after taking Please use attached dental resources to follow-up with dentistry for more permanent treatment If pain swelling worsening, developing fevers or neck stiffness, difficulty swallowing please follow-up in emergency room Bronchitis Begin azithromycin-2 tablets today, 1 tablet for the following 4 days Prednisone daily x5 days with food Albuterol inhaler as needed for shortness of breath, wheezing Tessalon for cough every 8 hours Please rest and drink plenty of fluids Covid swab pending, monitor my chart  Please follow-up if symptoms not resolving or worsening   ED Prescriptions    Medication Sig Dispense Auth. Provider   clindamycin (CLEOCIN) 300 MG capsule Take 1 capsule (300 mg total) by mouth 3 (three) times daily for 7 days. 21 capsule Amiel Sharrow C, PA-C   azithromycin (ZITHROMAX) 250 MG tablet Take 1 tablet (250 mg total) by mouth daily. Take first 2 tablets together, then 1 every day until finished. 6 tablet Tenicia Gural C, PA-C   benzonatate (TESSALON) 200 MG capsule Take 1 capsule (200 mg total) by mouth 3 (three) times daily as needed for up to 7 days for cough. 28 capsule Deah Ottaway C, PA-C   HYDROcodone-acetaminophen (NORCO/VICODIN) 5-325 MG tablet Take 1 tablet by mouth every 6 (six) hours as needed for severe pain. 8 tablet Shuntell Foody C, PA-C   albuterol (VENTOLIN HFA) 108 (90 Base) MCG/ACT inhaler Inhale 1-2 puffs into the lungs every 6 (six) hours as needed for wheezing or shortness of breath.  8 g Avonlea Sima C,  PA-C   predniSONE (DELTASONE) 50 MG tablet Take 1 tablet (50 mg total) by mouth daily for 5 days. 5 tablet Kelsi Benham, MizeHallie C, PA-C     I have reviewed the PDMP during this encounter.   Lew DawesWieters, Jailey Booton C, [place]PA-C 09/16/19 1503

## 2019-09-16 NOTE — Discharge Instructions (Signed)
Dental abscess Please begin taking clindamycin every 8 hours for the next week to treat dental abscess Use anti-inflammatories for pain/swelling. You may take up to 800 mg Ibuprofen every 8 hours with food. You may supplement Ibuprofen with Tylenol [phone] mg every 8 hours.  May use hydrocodone for severe/nightime pain, do not drive or work after taking Please use attached dental resources to follow-up with dentistry for more permanent treatment If pain swelling worsening, developing fevers or neck stiffness, difficulty swallowing please follow-up in emergency room Bronchitis Begin azithromycin-2 tablets today, 1 tablet for the following 4 days Prednisone daily x5 days with food Albuterol inhaler as needed for shortness of breath, wheezing Tessalon for cough every 8 hours Please rest and drink plenty of fluids Covid swab pending, monitor my chart  Please follow-up if symptoms not resolving or worsening

## 2019-09-18 LAB — NOVEL CORONAVIRUS, NAA (HOSP ORDER, SEND-OUT TO REF LAB; TAT 18-24 HRS): SARS-CoV-2, NAA: NOT DETECTED

## 2020-10-24 ENCOUNTER — Other Ambulatory Visit: Payer: Self-pay

## 2020-10-24 ENCOUNTER — Encounter (HOSPITAL_COMMUNITY): Payer: Self-pay | Admitting: Emergency Medicine

## 2020-10-24 ENCOUNTER — Emergency Department (HOSPITAL_COMMUNITY)
Admission: EM | Admit: 2020-10-24 | Discharge: 2020-10-24 | Disposition: A | Discharge status: Home / Self Care | Payer: Self-pay | Attending: Emergency Medicine | Admitting: Emergency Medicine

## 2020-10-24 DIAGNOSIS — R109 Unspecified abdominal pain: Secondary | ICD-10-CM | POA: Insufficient documentation

## 2020-10-24 DIAGNOSIS — R5381 Other malaise: Secondary | ICD-10-CM | POA: Insufficient documentation

## 2020-10-24 DIAGNOSIS — Z5321 Procedure and treatment not carried out due to patient leaving prior to being seen by health care provider: Secondary | ICD-10-CM | POA: Insufficient documentation

## 2020-10-24 DIAGNOSIS — F1120 Opioid dependence, uncomplicated: Secondary | ICD-10-CM | POA: Insufficient documentation

## 2020-10-24 NOTE — ED Triage Notes (Signed)
[facility] transported pt from [facility] [employer] on [address]   Pt reporting used heroin 2 days ago. Since that time he reports malaise, body aches, and abdominal pain.
